# Patient Record
Sex: Female | Born: 1995 | Race: White | Hispanic: No | Marital: Single | Smoking: Never smoker
Health system: Southern US, Community
[De-identification: ages and names within clinical notes are randomized; demographics above are authoritative.]

## PROBLEM LIST (undated history)

## (undated) DIAGNOSIS — B379 Candidiasis, unspecified: Secondary | ICD-10-CM

## (undated) DIAGNOSIS — N3010 Interstitial cystitis (chronic) without hematuria: Secondary | ICD-10-CM

## (undated) DIAGNOSIS — J45909 Unspecified asthma, uncomplicated: Secondary | ICD-10-CM

## (undated) DIAGNOSIS — IMO0001 Reserved for inherently not codable concepts without codable children: Secondary | ICD-10-CM

## (undated) DIAGNOSIS — N390 Urinary tract infection, site not specified: Secondary | ICD-10-CM

## (undated) DIAGNOSIS — R3915 Urgency of urination: Secondary | ICD-10-CM

## (undated) DIAGNOSIS — L237 Allergic contact dermatitis due to plants, except food: Secondary | ICD-10-CM

## (undated) HISTORY — DX: Reserved for inherently not codable concepts without codable children: IMO0001

## (undated) HISTORY — DX: Interstitial cystitis (chronic) without hematuria: N30.10

## (undated) HISTORY — DX: Urinary tract infection, site not specified: N39.0

## (undated) HISTORY — DX: Allergic contact dermatitis due to plants, except food: L23.7

## (undated) HISTORY — DX: Unspecified asthma, uncomplicated: J45.909

## (undated) HISTORY — DX: Candidiasis, unspecified: B37.9

## (undated) HISTORY — PX: APPENDECTOMY: SHX54

## (undated) HISTORY — DX: Urgency of urination: R39.15

---

## 2015-10-22 ENCOUNTER — Other Ambulatory Visit: Payer: Self-pay | Admitting: Family Medicine

## 2015-10-22 ENCOUNTER — Ambulatory Visit
Admission: RE | Admit: 2015-10-22 | Discharge: 2015-10-22 | Disposition: A | Discharge status: Home / Self Care | Payer: Managed Care, Other (non HMO) | Source: Ambulatory Visit | Attending: Family Medicine | Admitting: Family Medicine

## 2015-10-22 DIAGNOSIS — M545 Low back pain: Secondary | ICD-10-CM

## 2015-10-22 DIAGNOSIS — M5440 Lumbago with sciatica, unspecified side: Secondary | ICD-10-CM | POA: Insufficient documentation

## 2015-11-28 ENCOUNTER — Encounter: Payer: Self-pay | Admitting: *Deleted

## 2015-12-03 ENCOUNTER — Ambulatory Visit: Payer: Self-pay | Admitting: Urology

## 2015-12-03 ENCOUNTER — Encounter: Payer: Self-pay | Admitting: Urology

## 2016-08-14 ENCOUNTER — Encounter: Payer: Self-pay | Admitting: Emergency Medicine

## 2016-08-14 ENCOUNTER — Emergency Department
Admission: EM | Admit: 2016-08-14 | Discharge: 2016-08-14 | Disposition: A | Discharge status: Home / Self Care | Payer: Managed Care, Other (non HMO) | Attending: Emergency Medicine | Admitting: Emergency Medicine

## 2016-08-14 DIAGNOSIS — J45909 Unspecified asthma, uncomplicated: Secondary | ICD-10-CM | POA: Insufficient documentation

## 2016-08-14 DIAGNOSIS — R509 Fever, unspecified: Secondary | ICD-10-CM | POA: Diagnosis not present

## 2016-08-14 DIAGNOSIS — R112 Nausea with vomiting, unspecified: Secondary | ICD-10-CM | POA: Insufficient documentation

## 2016-08-14 DIAGNOSIS — R1111 Vomiting without nausea: Secondary | ICD-10-CM

## 2016-08-14 LAB — CBC
HCT: 44.1 % (ref 35.0–47.0)
HEMOGLOBIN: 14.5 g/dL (ref 12.0–16.0)
MCH: 29.3 pg (ref 26.0–34.0)
MCHC: 32.9 g/dL (ref 32.0–36.0)
MCV: 89.2 fL (ref 80.0–100.0)
PLATELETS: 315 10*3/uL (ref 150–440)
RBC: 4.94 MIL/uL (ref 3.80–5.20)
RDW: 13.3 % (ref 11.5–14.5)
WBC: 12.7 10*3/uL — AB (ref 3.6–11.0)

## 2016-08-14 LAB — URINALYSIS COMPLETE WITH MICROSCOPIC (ARMC ONLY)
BILIRUBIN URINE: NEGATIVE
Glucose, UA: NEGATIVE mg/dL
LEUKOCYTES UA: NEGATIVE
NITRITE: NEGATIVE
PH: 5 (ref 5.0–8.0)
PROTEIN: 30 mg/dL — AB
SPECIFIC GRAVITY, URINE: 1.03 (ref 1.005–1.030)

## 2016-08-14 LAB — COMPREHENSIVE METABOLIC PANEL
ALK PHOS: 73 U/L (ref 38–126)
ALT: 19 U/L (ref 14–54)
ANION GAP: 8 (ref 5–15)
AST: 27 U/L (ref 15–41)
Albumin: 4.6 g/dL (ref 3.5–5.0)
BUN: 17 mg/dL (ref 6–20)
CALCIUM: 9.6 mg/dL (ref 8.9–10.3)
CO2: 24 mmol/L (ref 22–32)
CREATININE: 0.97 mg/dL (ref 0.44–1.00)
Chloride: 105 mmol/L (ref 101–111)
Glucose, Bld: 114 mg/dL — ABNORMAL HIGH (ref 65–99)
Potassium: 3.5 mmol/L (ref 3.5–5.1)
SODIUM: 137 mmol/L (ref 135–145)
Total Bilirubin: 1.1 mg/dL (ref 0.3–1.2)
Total Protein: 7.8 g/dL (ref 6.5–8.1)

## 2016-08-14 LAB — POCT PREGNANCY, URINE: Preg Test, Ur: NEGATIVE

## 2016-08-14 LAB — LIPASE, BLOOD: LIPASE: 22 U/L (ref 11–51)

## 2016-08-14 LAB — MONONUCLEOSIS SCREEN: Mono Screen: NEGATIVE

## 2016-08-14 MED ORDER — ONDANSETRON HCL 4 MG/2ML IJ SOLN
4.0000 mg | Freq: Once | INTRAMUSCULAR | Status: AC
  Administered 2016-08-14: 4 mg via INTRAVENOUS

## 2016-08-14 MED ORDER — ONDANSETRON 4 MG PO TBDP: 4.0000 mg | ORAL_TABLET | Freq: Three times a day (TID) | ORAL | 0 refills | Status: AC | PRN

## 2016-08-14 MED ORDER — ONDANSETRON HCL 4 MG/2ML IJ SOLN
INTRAMUSCULAR | Status: AC
  Administered 2016-08-14: 4 mg via INTRAVENOUS
  Filled 2016-08-14: qty 2

## 2016-08-14 MED ORDER — SODIUM CHLORIDE 0.9 % IV BOLUS (SEPSIS)
1000.0000 mL | Freq: Once | INTRAVENOUS | Status: AC
  Administered 2016-08-14: 1000 mL via INTRAVENOUS

## 2016-08-14 NOTE — Discharge Instructions (Signed)
Please follow-up with her primary care doctor in the next several days for recheck/reevaluation. Return to the emergency department for any abdominal pain, vomiting unable to keep down fluids, or any other symptom personally concerning to yourself.

## 2016-08-14 NOTE — ED Notes (Signed)
Pt in with co n.v.d saw pmd today and given antiemetic states told to come to the ED for persistent symptoms.

## 2016-08-14 NOTE — ED Provider Notes (Signed)
[facility] Emergency Department Provider Note  Time seen: 9:03 PM  I have reviewed the triage vital signs and the nursing notes.   HISTORY  Chief Complaint Abdominal Pain    HPI Caitlin Park is a 20 y.o. female who presents to the emergency department nausea and vomiting. According to the patient one week ago she is having a sore throat and fever was diagnosed with strep throat and completed a Z-Pak. She states she has been feeling better until this morning she awoke around 4:00 feeling nauseated, began vomiting noticed bloody streaks in her vomit when the urgent care. At urgent care she states they checked a flu swab which was negative and gave her an antiemetic injection. Patient states this afternoon she once again began vomiting so she came to the emergency department for evaluation. Patient denies any abdominal pain, dysuria, states she did have fever to 101 this morning. States the sore throat has resolved. Denies vaginal discharge. States she is currently on her period.  Past Medical History:  Diagnosis Date  . Asthma   . Frequency   . IC (interstitial cystitis)   . Poison ivy   . Urgency of micturation   . UTI (lower urinary tract infection)   . Yeast infection     There are no active problems to display for this patient.   Past Surgical History:  Procedure Laterality Date  . APPENDECTOMY      Prior to Admission medications   Not on File    No Known Allergies  Family History  Problem Relation Age of Onset  . Kidney disease Neg Hx   . Cancer Neg Hx     Social History Social History  Substance Use Topics  . Smoking status: Never Smoker  . Smokeless tobacco: Never Used  . Alcohol use No    Review of Systems Constitutional: Fever to 101 this morning. Cardiovascular: Negative for chest pain. Respiratory: Negative for shortness of breath. Negative for cough or congestion. Gastrointestinal: Negative for abdominal pain positive for  vomiting. Negative for diarrhea. Genitourinary: Negative for dysuria. On her period. Negative for discharge. Musculoskeletal: Negative for back pain. Neurological: Negative for headache 10-point ROS otherwise negative.  ____________________________________________   PHYSICAL EXAM:  VITAL SIGNS: ED Triage Vitals [08/14/16 1928]  Enc Vitals Group     BP 102/66     Pulse Rate (!) 113     Resp 17     Temp 98.8 F (37.1 C)     Temp Source Oral     SpO2 98 %     Weight 135 lb (61.2 kg)     Height 5\' 1"  (1.549 m)     Head Circumference      Peak Flow      Pain Score 4     Pain Loc      Pain Edu?      Excl. in GC?     Constitutional: Alert and oriented. Well appearing and in no distress. Eyes: Normal exam ENT   Head: Normocephalic and atraumatic   Mouth/Throat: Mucous membranes are moist. Cardiovascular: Normal rate, regular rhythm. No murmur Respiratory: Normal respiratory effort without tachypnea nor retractions. Breath sounds are clear  Gastrointestinal: Soft and nontender. No distention. No CVA tenderness.  Musculoskeletal: Nontender with normal range of motion in all extremities.  Neurologic:  Normal speech and language. No gross focal neurologic deficits  Skin:  Skin is warm, dry and intact.  Psychiatric: Mood and affect are normal.   ____________________________________________  INITIAL IMPRESSION / ASSESSMENT AND PLAN / ED COURSE  Pertinent labs & imaging results that were available during my care of the patient were reviewed by me and considered in my medical decision making (see chart for details).  Patient presents for nausea and vomiting beginning this morning which has continued. Denies diarrhea. States fever to 101 earlier today. Overall the patient appears very well, no distress, nontender abdominal exam. We will check labs, IV hydrate, treat nausea and closely monitor in the emergency department while awaiting lab results. Patient agreeable to  plan.  Patient's labs have all resulted largely within normal limits. Mononucleosis negative. Pregnancy test negative. Patient states she is feeling much better. We'll discharge with Zofran ODT to be used as needed. Patient will hydrate at home. I discussed return precautions for any abdominal pain, or worsening symptoms.  ____________________________________________   FINAL CLINICAL IMPRESSION(S) / ED DIAGNOSES  Nausea and vomiting    Minna AntisKevin Aisha Greenberger, MD 08/14/16 2150

## 2016-08-14 NOTE — ED Triage Notes (Addendum)
Patient ambulatory to triage with steady gait, without difficulty or distress noted; Pt reports received antiemetic injection today at Urgent Care at 8am--flu swab negative; c/o persistent N/V/D, lower abd pain; completed zpak last week for strep

## 2016-08-14 NOTE — ED Notes (Signed)

## 2016-09-26 IMAGING — CR DG LUMBAR SPINE COMPLETE 4+V
1 series · 5 of 5 positions shown · non-contrast
Comparison: None.

CLINICAL DATA: Low back pain and sacral pain.  Fall on 10/08/2015

EXAM:
LUMBAR SPINE - COMPLETE 4+ VIEW

[Series 1: dg lumbar spine complete 4 +v · 0.14mm/px · 5 of 5 slices shown]
[im 1/5]
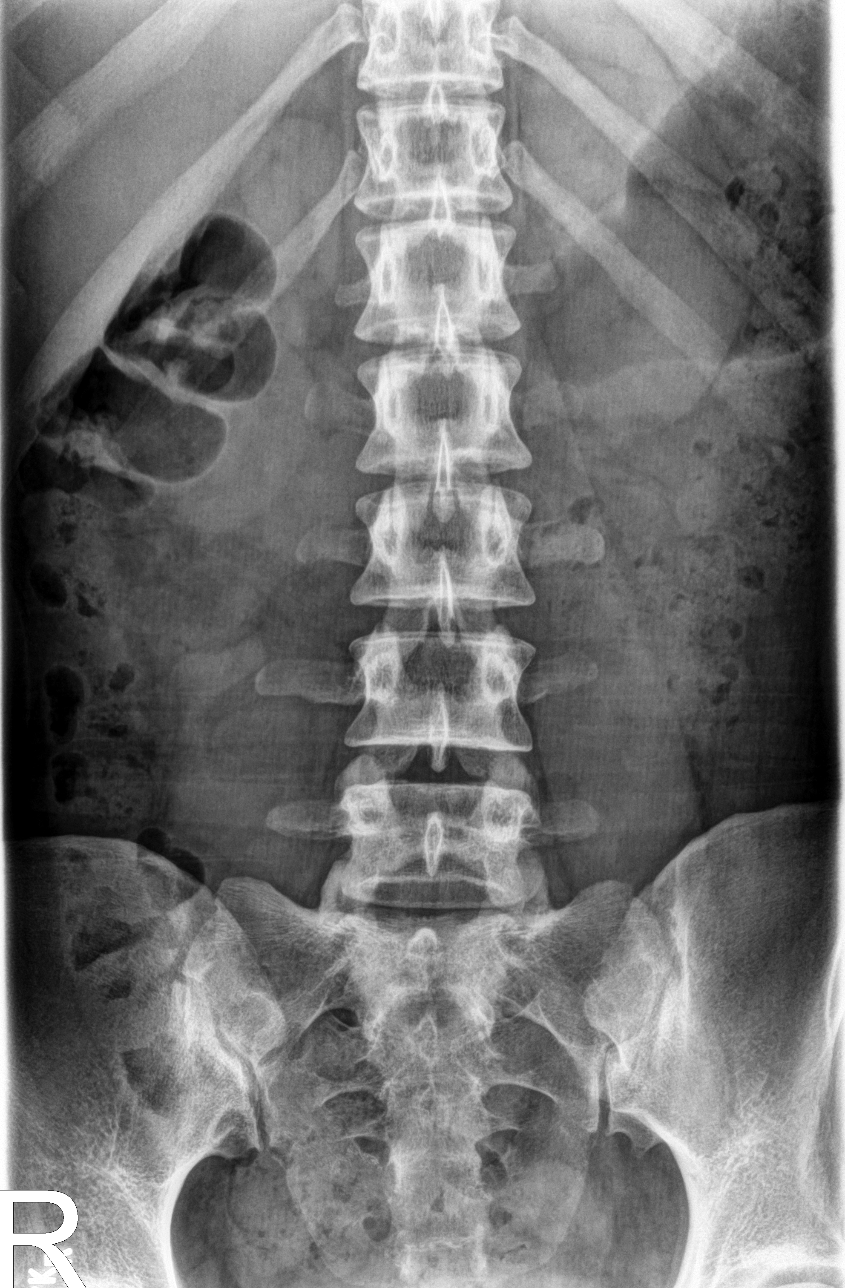
[im 2/5]
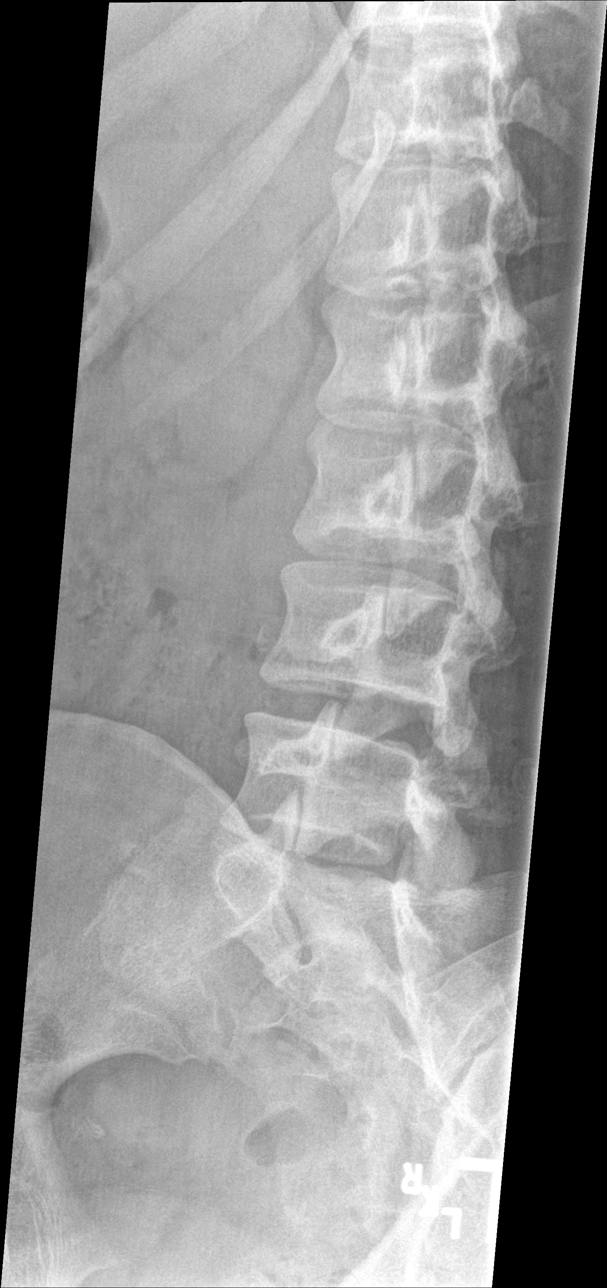
[im 3/5]
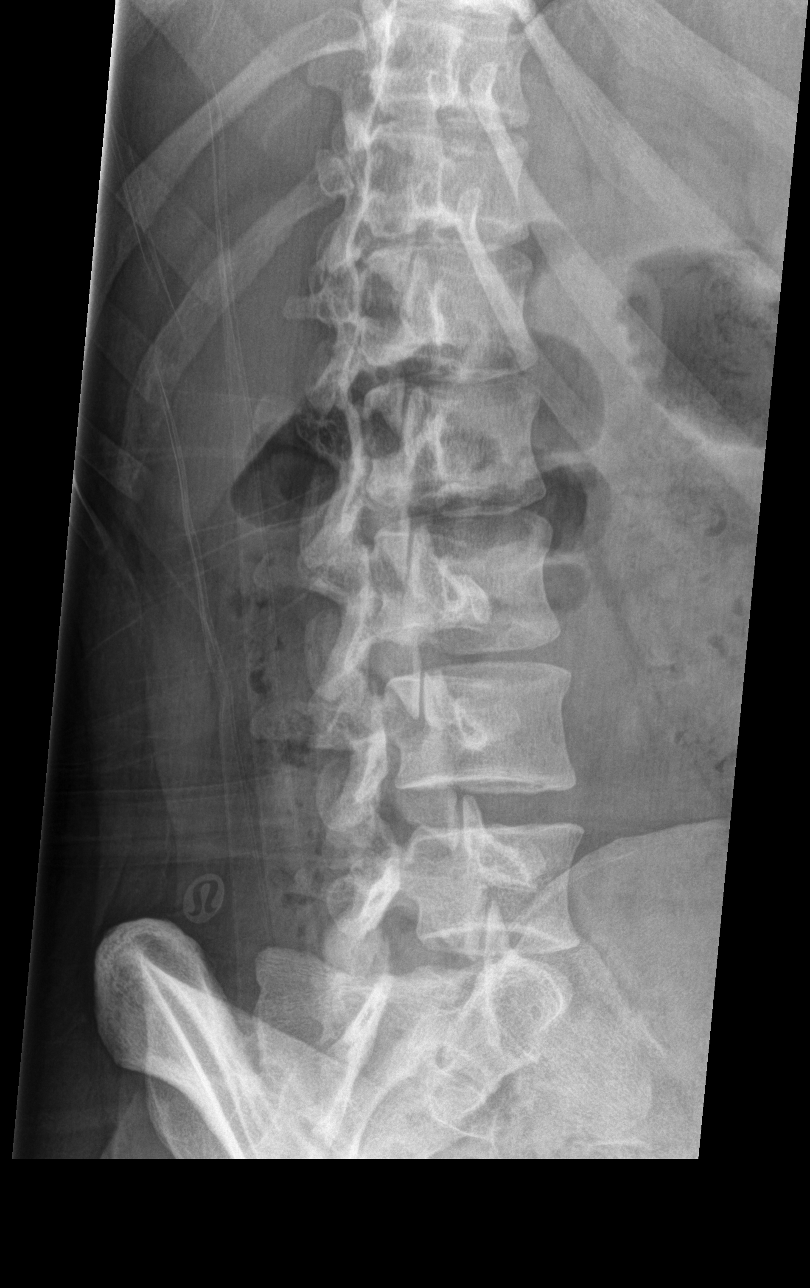
[im 4/5]
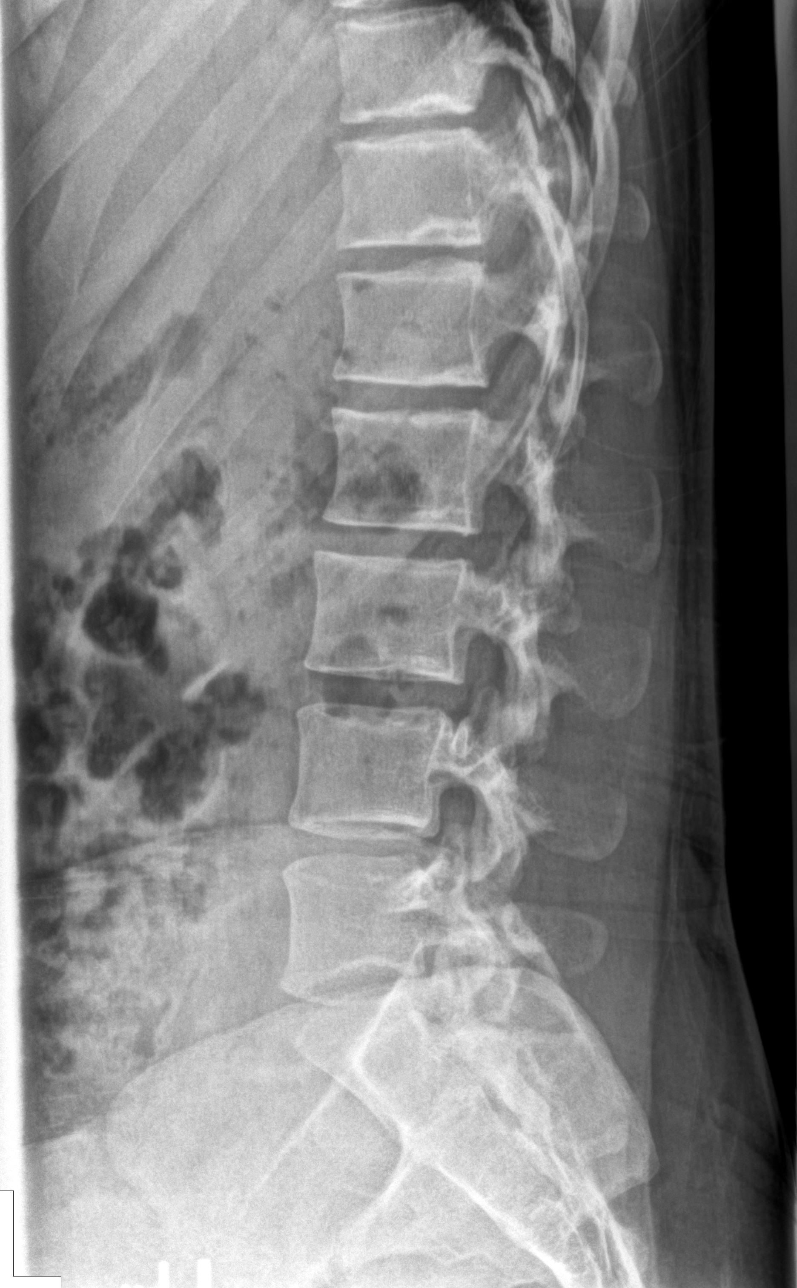
[im 5/5]
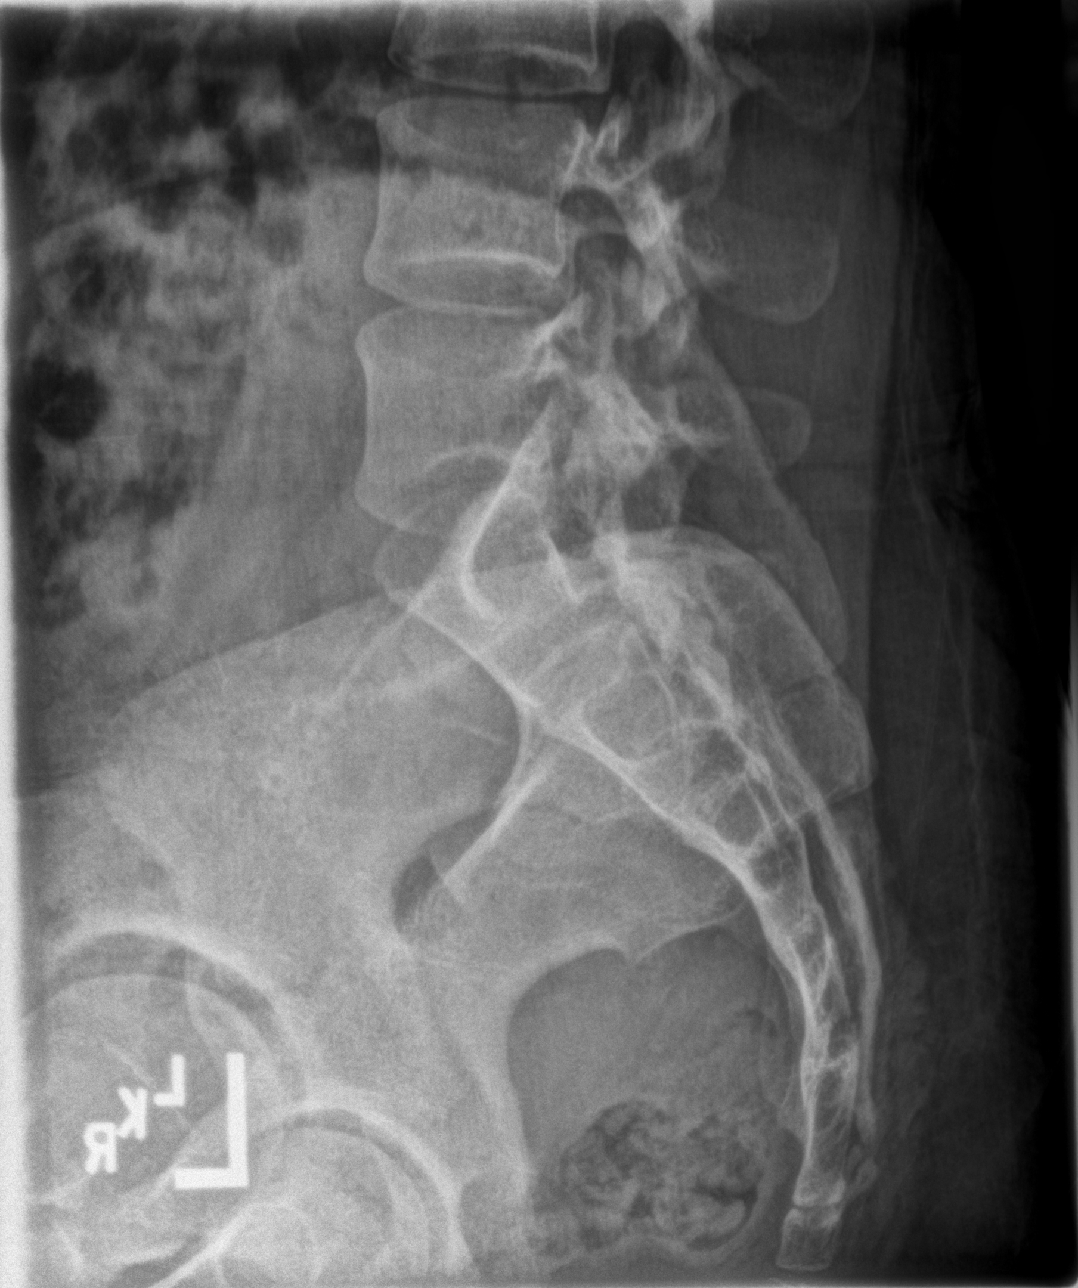

[5 of 5 positions shown; findings below may reference images not displayed]

FINDINGS: There is no evidence of lumbar spine fracture. Alignment is normal.
Intervertebral disc spaces are maintained.
IMPRESSION: Negative.
